# Patient Record
Sex: Male | Born: 2000 | Race: Black or African American | Hispanic: No | Marital: Single
Health system: Southern US, Community
[De-identification: ages and names within clinical notes are randomized; demographics above are authoritative.]

## PROBLEM LIST (undated history)

## (undated) DIAGNOSIS — N051 Unspecified nephritic syndrome with focal and segmental glomerular lesions: Secondary | ICD-10-CM

## (undated) DIAGNOSIS — N049 Nephrotic syndrome with unspecified morphologic changes: Secondary | ICD-10-CM

## (undated) DIAGNOSIS — F909 Attention-deficit hyperactivity disorder, unspecified type: Secondary | ICD-10-CM

## (undated) HISTORY — DX: Attention-deficit hyperactivity disorder, unspecified type: F90.9

## (undated) HISTORY — DX: Unspecified nephritic syndrome with focal and segmental glomerular lesions: N05.1

---
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
## 2022-12-19 DIAGNOSIS — R809 Proteinuria, unspecified: Secondary | ICD-10-CM | POA: Diagnosis not present

## 2022-12-19 DIAGNOSIS — E559 Vitamin D deficiency, unspecified: Secondary | ICD-10-CM | POA: Diagnosis not present

## 2022-12-19 DIAGNOSIS — N058 Unspecified nephritic syndrome with other morphologic changes: Secondary | ICD-10-CM | POA: Diagnosis not present

## 2022-12-19 DIAGNOSIS — Z79899 Other long term (current) drug therapy: Secondary | ICD-10-CM | POA: Diagnosis not present

## 2023-04-24 DIAGNOSIS — N058 Unspecified nephritic syndrome with other morphologic changes: Secondary | ICD-10-CM | POA: Diagnosis not present

## 2023-08-28 DIAGNOSIS — N058 Unspecified nephritic syndrome with other morphologic changes: Secondary | ICD-10-CM | POA: Diagnosis not present

## 2023-08-28 DIAGNOSIS — Z7961 Long term (current) use of immunomodulator: Secondary | ICD-10-CM | POA: Diagnosis not present
